# Patient Record
Sex: Female | Born: 2000 | Race: Black or African American | Hispanic: No | Marital: Single | Smoking: Current some day smoker
Health system: Southern US, Community
[De-identification: ages and names within clinical notes are randomized; demographics above are authoritative.]

## PROBLEM LIST (undated history)

## (undated) DIAGNOSIS — Z7289 Other problems related to lifestyle: Secondary | ICD-10-CM

## (undated) DIAGNOSIS — F332 Major depressive disorder, recurrent severe without psychotic features: Secondary | ICD-10-CM

---
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
## 2020-07-21 ENCOUNTER — Encounter (HOSPITAL_BASED_OUTPATIENT_CLINIC_OR_DEPARTMENT_OTHER): Payer: Self-pay

## 2020-07-21 ENCOUNTER — Other Ambulatory Visit: Payer: Self-pay

## 2020-07-21 ENCOUNTER — Emergency Department (HOSPITAL_BASED_OUTPATIENT_CLINIC_OR_DEPARTMENT_OTHER)
Admission: EM | Admit: 2020-07-21 | Discharge: 2020-07-21 | Disposition: A | Discharge status: Home / Self Care | Payer: Medicaid Other | Attending: Emergency Medicine | Admitting: Emergency Medicine

## 2020-07-21 DIAGNOSIS — Y9289 Other specified places as the place of occurrence of the external cause: Secondary | ICD-10-CM | POA: Diagnosis not present

## 2020-07-21 DIAGNOSIS — S0990XA Unspecified injury of head, initial encounter: Secondary | ICD-10-CM | POA: Insufficient documentation

## 2020-07-21 DIAGNOSIS — W208XXA Other cause of strike by thrown, projected or falling object, initial encounter: Secondary | ICD-10-CM | POA: Diagnosis not present

## 2020-07-21 DIAGNOSIS — H6122 Impacted cerumen, left ear: Secondary | ICD-10-CM | POA: Insufficient documentation

## 2020-07-21 DIAGNOSIS — F17290 Nicotine dependence, other tobacco product, uncomplicated: Secondary | ICD-10-CM | POA: Diagnosis not present

## 2020-07-21 DIAGNOSIS — Y990 Civilian activity done for income or pay: Secondary | ICD-10-CM | POA: Insufficient documentation

## 2020-07-21 DIAGNOSIS — Y9389 Activity, other specified: Secondary | ICD-10-CM | POA: Diagnosis not present

## 2020-07-21 NOTE — ED Triage Notes (Signed)
Pt arrives with c/o having a box fall on her head at work today, denies any neck or back pain, denies any LOC.

## 2020-07-21 NOTE — ED Provider Notes (Signed)
[facility] EMERGENCY DEPARTMENT Provider Note  CSN: [id number] Arrival date & time: 07/21/20 0756    History Chief Complaint  Patient presents with   Headache    Theresa Frye is a 20 y.o. female reports about 6am this morning while she was at work, moving some boxes, one of them fell off a shelf hitting her on the top of her head. She did not have LOC or pain initially. A few hours later while on her way home from work she noticed some soreness to her scalp and came to the ED for evaluation. She also reports some fullness in her left ear, not related to the head injury. She is not on a blood thinner.    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 No pertinent surgical history.  No family history on file.  Social History   Tobacco Use   Smoking status: Some Days    Pack years: 0.00    Types: Cigars   Smokeless tobacco: Never  Vaping Use   Vaping Use: Some days  Substance Use Topics   Alcohol use: Yes    Comment: social   Drug use: Never     Home Medications Prior to Admission medications   Medication Sig Start Date End Date Taking? Authorizing Provider  cetirizine (ZYRTEC) 10 MG tablet Take 10 mg by mouth daily. 10/08/16   [provider]  FLUoxetine (PROZAC) 20 MG capsule Take 1 capsule (20 mg total) by mouth daily. 10/19/16   Denzil Magnuson, NP  hydrocortisone cream 0.5 % Apply 1 application topically as needed for itching.    [provider]  hydrOXYzine (ATARAX/VISTARIL) 25 MG tablet Take 1 tablet (25 mg total) by mouth at bedtime. 10/18/16   Denzil Magnuson, NP     Allergies    Patient has no known allergies.   Review of Systems   Review of Systems A comprehensive review of systems was completed and negative except as noted in HPI.    Physical Exam BP 126/76   Pulse 86   Temp  98.2 F (36.8 C) (Oral)   Resp 18   Ht 5\' 6"  (1.676 m)   Wt 68 kg   LMP 06/26/2020   SpO2 100%   BMI 24.21 kg/m   Physical Exam Vitals and nursing note reviewed.  Constitutional:      Appearance: Normal appearance.  HENT:     Head: Normocephalic.     Comments: Mild soft tissue swelling to scalp, no hematoma or signs of depressed skull fracture    Right Ear: Tympanic membrane and ear canal normal.     Left Ear: Tympanic membrane normal. There is impacted cerumen.     Nose: Nose normal.     Mouth/Throat:     Mouth: Mucous membranes are moist.  Eyes:     Extraocular Movements: Extraocular movements intact.     Conjunctiva/sclera: Conjunctivae normal.  Cardiovascular:     Rate and Rhythm: Normal rate.  Pulmonary:     Effort: Pulmonary effort is normal.     Breath sounds: Normal breath sounds.  Abdominal:     General: Abdomen is flat.     Palpations: Abdomen is soft.     Tenderness: There is no abdominal tenderness.  Musculoskeletal:        General: No swelling. Normal range of motion.     Cervical back: Neck  supple.  Skin:    General: Skin is warm and dry.  Neurological:     General: No focal deficit present.     Mental Status: She is alert and oriented to person, place, and time.     Cranial Nerves: No cranial nerve deficit.     Sensory: No sensory deficit.     Motor: No weakness.     Gait: Gait normal.  Psychiatric:        Mood and Affect: Mood normal.     ED Results / Procedures / Treatments   Labs (all labs ordered are listed, but only abnormal results are displayed) Labs Reviewed - No data to display  EKG None   Radiology No results found.  Procedures .Ear Cerumen Removal  Date/Time: 07/21/2020 8:59 AM Performed by: Pollyann Savoy, MD Authorized by: Pollyann Savoy, MD   Consent:    Consent obtained:  Verbal   Consent given by:  Patient Procedure details:    Location:  L ear   Procedure type: curette     Procedure outcomes: cerumen  removed   Post-procedure details:    Inspection:  Ear canal clear and TM intact   Hearing quality:  Improved   Procedure completion:  Tolerated  Medications Ordered in the ED Medications - No data to display   MDM Rules/Calculators/A&P MDM Patient with minor head injury, no LOC, no pain initially with superficial scalp swelling. No concern for clinically significant intracranial injury. Patient and SO at bedside are requesting imaging but I explained that it was not indicated and potentially harmful. Risks outweigh benefits.   ED Course  I have reviewed the triage vital signs and the nursing notes.  Pertinent labs & imaging results that were available during my care of the patient were reviewed by me and considered in my medical decision making (see chart for details).     Final Clinical Impression(s) / ED Diagnoses Final diagnoses:  Minor head injury, initial encounter  Impacted cerumen of left ear    Rx / DC Orders ED Discharge Orders     None        Pollyann Savoy, MD 07/21/20 0900

## 2020-11-21 ENCOUNTER — Encounter (HOSPITAL_BASED_OUTPATIENT_CLINIC_OR_DEPARTMENT_OTHER): Payer: Self-pay

## 2020-11-21 ENCOUNTER — Emergency Department (HOSPITAL_BASED_OUTPATIENT_CLINIC_OR_DEPARTMENT_OTHER)
Admission: EM | Admit: 2020-11-21 | Discharge: 2020-11-21 | Disposition: A | Discharge status: Home / Self Care | Payer: Medicaid Other | Attending: Student | Admitting: Student

## 2020-11-21 ENCOUNTER — Other Ambulatory Visit: Payer: Self-pay

## 2020-11-21 ENCOUNTER — Emergency Department (HOSPITAL_BASED_OUTPATIENT_CLINIC_OR_DEPARTMENT_OTHER): Payer: Medicaid Other

## 2020-11-21 DIAGNOSIS — R059 Cough, unspecified: Secondary | ICD-10-CM | POA: Diagnosis present

## 2020-11-21 DIAGNOSIS — Z20822 Contact with and (suspected) exposure to covid-19: Secondary | ICD-10-CM | POA: Insufficient documentation

## 2020-11-21 DIAGNOSIS — J101 Influenza due to other identified influenza virus with other respiratory manifestations: Secondary | ICD-10-CM | POA: Insufficient documentation

## 2020-11-21 DIAGNOSIS — F17290 Nicotine dependence, other tobacco product, uncomplicated: Secondary | ICD-10-CM | POA: Diagnosis not present

## 2020-11-21 LAB — RESP PANEL BY RT-PCR (FLU A&B, COVID) ARPGX2
Influenza A by PCR: POSITIVE — AB
Influenza B by PCR: NEGATIVE
SARS Coronavirus 2 by RT PCR: NEGATIVE

## 2020-11-21 MED ORDER — BENZONATATE 100 MG PO CAPS: 100.0000 mg | ORAL_CAPSULE | Freq: Three times a day (TID) | ORAL | 0 refills | Status: DC

## 2020-11-21 MED ORDER — PREDNISONE 20 MG PO TABS: 40.0000 mg | ORAL_TABLET | Freq: Every day | ORAL | 0 refills | Status: AC

## 2020-11-21 NOTE — Discharge Instructions (Addendum)
Your chest x-ray was negative.  Respiratory panel was positive for flu.  Continue taking loratadine and start taking your Flonase consistently as well to help with sinus congestion.  I have sent in Tessalon Perles and prednisone for you.  Return to the emergency room if you have uncontrolled fever, or vomiting to the point you are unable to tolerate any food or drink.  Otherwise you can follow-up with your primary care provider.

## 2020-11-21 NOTE — ED Provider Notes (Signed)
[facility] EMERGENCY DEPARTMENT Provider Note   CSN: [id number] Arrival date & time: 11/21/20  1154     History Chief Complaint  Patient presents with   Cough    Chest pain    Theresa Frye is a 20 y.o. female.  19 year old female presents today for evaluation of sinus congestion, and productive cough of 2-week duration.  Patient reports she has tried over-the-counter medicine without any relief including loratadine.  She denies any fever, nausea, abdominal pain, or significant shortness of breath.  She does report some chest pain following coughing spells.  She also reports following coughing spells when she gets a little short of breath requiring her to use her albuterol inhaler.  She denies any lightheadedness, or palpitations.  The history is provided by the patient. No language interpreter was used.      Past Medical History:  Diagnosis Date   Deliberate self-cutting    MDD (major depressive disorder), recurrent severe, without psychosis ([place]) 10/14/2016    Patient Active Problem List   Diagnosis Date Noted   MDD (major depressive disorder), recurrent severe, without psychosis ([place]) 10/14/2016   Social anxiety disorder 10/14/2016   Insomnia 10/14/2016   MDD (major depressive disorder) 10/13/2016    History reviewed. No pertinent surgical history.   OB History   No obstetric history on file.     History reviewed. No pertinent family history.  Social History   Tobacco Use   Smoking status: Some Days    Types: Cigars   Smokeless tobacco: Never  Vaping Use   Vaping Use: Some days  Substance Use Topics   Alcohol use: Yes    Comment: social   Drug use: Never    Home Medications Prior to Admission medications   Medication Sig Start Date End Date Taking? Authorizing Provider  cetirizine (ZYRTEC) 10 MG tablet Take 10 mg by mouth daily. 10/08/16   [provider]  FLUoxetine (PROZAC) 20 MG capsule Take 1 capsule (20 mg total)  by mouth daily. 10/19/16   Mordecai Maes, NP  hydrocortisone cream 0.5 % Apply 1 application topically as needed for itching.    [provider]  hydrOXYzine (ATARAX/VISTARIL) 25 MG tablet Take 1 tablet (25 mg total) by mouth at bedtime. 10/18/16   Mordecai Maes, NP    Allergies    Patient has no known allergies.  Review of Systems   Review of Systems  Constitutional:  Negative for activity change, chills and fever.  HENT:  Positive for congestion, postnasal drip and sinus pressure.   Respiratory:  Positive for cough. Negative for shortness of breath.   Cardiovascular:  Positive for chest pain (only following coughing spells.).  Gastrointestinal:  Negative for abdominal pain, nausea and vomiting.  Genitourinary:  Negative for decreased urine volume.  Musculoskeletal:  Negative for myalgias.  Neurological:  Negative for weakness and headaches.  All other systems reviewed and are negative.  Physical Exam Updated Vital Signs BP (!) 141/92 (BP Location: Left Arm)   Pulse 93   Temp 98.7 F (37.1 C) (Oral)   Resp 16   Ht 5\' 6"  (1.676 m)   Wt 68 kg   SpO2 99%   BMI 24.21 kg/m   Physical Exam Vitals and nursing note reviewed.  Constitutional:      General: She is not in acute distress.    Appearance: Normal appearance. She is not ill-appearing.  HENT:     Head: Normocephalic and atraumatic.     Nose: Nose normal.  Eyes:     General: No scleral icterus.    Extraocular Movements: Extraocular movements intact.     Conjunctiva/sclera: Conjunctivae normal.  Cardiovascular:     Rate and Rhythm: Normal rate and regular rhythm.     Pulses: Normal pulses.     Heart sounds: Normal heart sounds.  Pulmonary:     Effort: Pulmonary effort is normal. No respiratory distress.     Breath sounds: Normal breath sounds. No wheezing or rales.  Abdominal:     General: There is no distension.     Tenderness: There is no abdominal tenderness.  Musculoskeletal:        General:  Normal range of motion.     Cervical back: Normal range of motion.  Skin:    General: Skin is warm and dry.  Neurological:     General: No focal deficit present.     Mental Status: She is alert. Mental status is at baseline.    ED Results / Procedures / Treatments   Labs (all labs ordered are listed, but only abnormal results are displayed) Labs Reviewed  RESP PANEL BY RT-PCR (FLU A&B, COVID) [id number]    EKG None  Radiology No results found.  Procedures Procedures   Medications Ordered in ED Medications - No data to display  ED Course  I have reviewed the triage vital signs and the nursing notes.  Pertinent labs & imaging results that were available during my care of the patient were reviewed by me and considered in my medical decision making (see chart for details).    MDM Rules/Calculators/A&P                           20 year old female presents today for evaluation of 2-week duration of productive cough and sinus congestion.  She denies fever, shortness of breath.  She does report chest pain following coughing spells.  Respiratory panel with COVID, flu, and RSV ordered.  Chest x-ray ordered.  Chest x-ray unremarkable.  Respiratory panel significant for flu.  Return precautions discussed.  Symptomatic treatment discussed.  Discussed importance of follow-up with PCP.  Final Clinical Impression(s) / ED Diagnoses Final diagnoses:  None    Rx / DC Orders ED Discharge Orders     None        Marita Kansas, PA-C 11/21/20 1459    Glendora Score, MD 11/21/20 1609

## 2020-11-21 NOTE — ED Triage Notes (Signed)
Pt reports cough, congestion for past week, pt reports chest pain beginning yesterday.  Denies fever, denies n/v/d, eating and drinking normally.  Taking allergy medication (Loratadine) without improvement

## 2021-10-11 ENCOUNTER — Encounter (HOSPITAL_COMMUNITY): Payer: Self-pay

## 2021-10-11 ENCOUNTER — Ambulatory Visit (HOSPITAL_COMMUNITY)
Admission: EM | Admit: 2021-10-11 | Discharge: 2021-10-11 | Disposition: A | Discharge status: Home / Self Care | Payer: Medicaid Other | Attending: Emergency Medicine | Admitting: Emergency Medicine

## 2021-10-11 DIAGNOSIS — K5900 Constipation, unspecified: Secondary | ICD-10-CM

## 2021-10-11 DIAGNOSIS — R140 Abdominal distension (gaseous): Secondary | ICD-10-CM

## 2021-10-11 MED ORDER — POLYETHYLENE GLYCOL 3350 17 GM/SCOOP PO POWD: 17.0000 g | Freq: Every day | ORAL | 0 refills | Status: DC

## 2021-10-11 NOTE — ED Provider Notes (Signed)
[facility]    CSN: [id number] Arrival date & time: 10/11/21  1000     History   Chief Complaint Chief Complaint  Patient presents with   Abdominal Pain    HPI Theresa Frye is a 21 y.o. female.  Presents with 1 week history abdominal cramping, bloating, constipation. Last BM 1 week ago, usually has them every 2-3 days. Has been eating out a lot lately Denies nausea, vomiting  Has not tried anything for her symptoms Drinks a lot of water but does not eat much fiber  LMP last week, feels her menstrual cycle is related to constipation in the past  Past Medical History:  Diagnosis Date   Deliberate self-cutting    MDD (major depressive disorder), recurrent severe, without psychosis ([place]) 10/14/2016    Patient Active Problem List   Diagnosis Date Noted   MDD (major depressive disorder), recurrent severe, without psychosis ([place]) 10/14/2016   Social anxiety disorder 10/14/2016   Insomnia 10/14/2016   MDD (major depressive disorder) 10/13/2016    History reviewed. No pertinent surgical history.  OB History   No obstetric history on file.      Home Medications    Prior to Admission medications   Medication Sig Start Date End Date Taking? Authorizing Provider  polyethylene glycol powder (GLYCOLAX) 17 GM/SCOOP powder Take 17 g by mouth daily. 10/11/21  Yes Reyne Falconi, Vernice Jefferson    Family History History reviewed. No pertinent family history.  Social History Social History   Tobacco Use   Smoking status: Some Days    Types: Cigars   Smokeless tobacco: Never  Vaping Use   Vaping Use: Some days  Substance Use Topics   Alcohol use: Yes    Comment: social   Drug use: Never     Allergies   Patient has no known allergies.   Review of Systems Review of Systems  Gastrointestinal:  Positive for abdominal pain.   Per HPI  Physical Exam Triage Vital Signs ED Triage Vitals  Enc Vitals Group     BP 10/11/21 1034 130/70     Pulse  Rate 10/11/21 1034 80     Resp 10/11/21 1034 16     Temp 10/11/21 1034 98.8 F (37.1 C)     Temp Source 10/11/21 1034 Oral     SpO2 10/11/21 1034 98 %     Weight --      Height --      Head Circumference --      Peak Flow --      Pain Score 10/11/21 1035 5     Pain Loc --      Pain Edu? --      Excl. in [place]? --    No data found.  Updated Vital Signs BP 130/70 (BP Location: Left Arm)   Pulse 80   Temp 98.8 F (37.1 C) (Oral)   Resp 16   LMP 09/27/2021   SpO2 98%      Physical Exam Vitals and nursing note reviewed.  Constitutional:      Appearance: Normal appearance.  HENT:     Mouth/Throat:     Mouth: Mucous membranes are moist.     Pharynx: Oropharynx is clear.  Eyes:     Conjunctiva/sclera: Conjunctivae normal.  Cardiovascular:     Rate and Rhythm: Normal rate and regular rhythm.     Heart sounds: Normal heart sounds.  Pulmonary:     Effort: Pulmonary effort is normal. No respiratory distress.  Breath sounds: Normal breath sounds.  Abdominal:     General: Bowel sounds are normal.     Palpations: Abdomen is soft.     Tenderness: There is no abdominal tenderness. There is no right CVA tenderness, left CVA tenderness, guarding or rebound.     Comments: Generalized discomfort  Musculoskeletal:        General: Normal range of motion.  Skin:    General: Skin is warm and dry.  Neurological:     Mental Status: She is alert and oriented to person, place, and time.      UC Treatments / Results  Labs (all labs ordered are listed, but only abnormal results are displayed) Labs Reviewed - No data to display  EKG  Radiology No results found.  Procedures Procedures (including critical care time)  Medications Ordered in UC Medications - No data to display  Initial Impression / Assessment and Plan / UC Course  I have reviewed the triage vital signs and the nursing notes.  Pertinent labs & imaging results that were available during my care of the patient  were reviewed by me and considered in my medical decision making (see chart for details).  Likely constipation, especially given history of same after menstrual cycles Discussed MiraLAX cleanse out Provided list of high-fiber foods, discussed diet changes in regards to less eating out and more fruits/veg Return precautions discussed. Patient agrees to plan  Final Clinical Impressions(s) / UC Diagnoses   Final diagnoses:  Constipation, unspecified constipation type  Abdominal bloating     Discharge Instructions      I recommend trying the polyethylene glycol cleanse out. (1 capful = 17 g, which is one dose).  Increase your water and fiber intake.  Please follow up if you do not have a bowel movement within 2-3 days.     ED Prescriptions     Medication Sig Dispense Auth. Provider   polyethylene glycol powder (GLYCOLAX) 17 GM/SCOOP powder Take 17 g by mouth daily. 255 g Joziyah Roblero, Wells Guiles, PA-C      PDMP not reviewed this encounter.   Jesse Nosbisch, Wells Guiles, PA-C 10/11/21 1139

## 2021-10-11 NOTE — ED Triage Notes (Signed)
Pt c/o constipation with abdominal pain and bloating x1wk. Denies taking anything for sx's. Hx of same. Last NBM a week ago.

## 2021-10-11 NOTE — Discharge Instructions (Addendum)
I recommend trying the polyethylene glycol cleanse out. (1 capful = 17 g, which is one dose).  Increase your water and fiber intake.  Please follow up if you do not have a bowel movement within 2-3 days.

## 2021-12-01 ENCOUNTER — Emergency Department (HOSPITAL_COMMUNITY)
Admission: EM | Admit: 2021-12-01 | Discharge: 2021-12-01 | Discharge status: Against Medical Advice | Payer: Medicaid Other | Attending: Emergency Medicine | Admitting: Emergency Medicine

## 2021-12-01 ENCOUNTER — Other Ambulatory Visit: Payer: Self-pay

## 2021-12-01 ENCOUNTER — Encounter (HOSPITAL_COMMUNITY): Payer: Self-pay

## 2021-12-01 DIAGNOSIS — Z5321 Procedure and treatment not carried out due to patient leaving prior to being seen by health care provider: Secondary | ICD-10-CM | POA: Insufficient documentation

## 2021-12-01 DIAGNOSIS — F410 Panic disorder [episodic paroxysmal anxiety] without agoraphobia: Secondary | ICD-10-CM | POA: Diagnosis present

## 2021-12-01 LAB — RAPID URINE DRUG SCREEN, HOSP PERFORMED
Amphetamines: NOT DETECTED
Barbiturates: NOT DETECTED
Benzodiazepines: NOT DETECTED
Cocaine: NOT DETECTED
Opiates: NOT DETECTED
Tetrahydrocannabinol: POSITIVE — AB

## 2021-12-01 LAB — CBC
HCT: 37.2 % (ref 36.0–46.0)
Hemoglobin: 12.6 g/dL (ref 12.0–15.0)
MCH: 31.2 pg (ref 26.0–34.0)
MCHC: 33.9 g/dL (ref 30.0–36.0)
MCV: 92.1 fL (ref 80.0–100.0)
Platelets: 423 10*3/uL — ABNORMAL HIGH (ref 150–400)
RBC: 4.04 MIL/uL (ref 3.87–5.11)
RDW: 12.7 % (ref 11.5–15.5)
WBC: 7.6 10*3/uL (ref 4.0–10.5)
nRBC: 0 % (ref 0.0–0.2)

## 2021-12-01 LAB — COMPREHENSIVE METABOLIC PANEL
ALT: 18 U/L (ref 0–44)
AST: 20 U/L (ref 15–41)
Albumin: 4.1 g/dL (ref 3.5–5.0)
Alkaline Phosphatase: 78 U/L (ref 38–126)
Anion gap: 14 (ref 5–15)
BUN: 11 mg/dL (ref 6–20)
CO2: 20 mmol/L — ABNORMAL LOW (ref 22–32)
Calcium: 9.7 mg/dL (ref 8.9–10.3)
Chloride: 104 mmol/L (ref 98–111)
Creatinine, Ser: 0.99 mg/dL (ref 0.44–1.00)
GFR, Estimated: >60 mL/min (ref >60)
Glucose, Bld: 115 mg/dL — ABNORMAL HIGH (ref 70–99)
Potassium: 3.1 mmol/L — ABNORMAL LOW (ref 3.5–5.1)
Sodium: 138 mmol/L (ref 135–145)
Total Bilirubin: 0.8 mg/dL (ref 0.3–1.2)
Total Protein: 7.4 g/dL (ref 6.5–8.1)

## 2021-12-01 LAB — SALICYLATE LEVEL: Salicylate Lvl: <7 mg/dL — ABNORMAL LOW (ref 7.0–30.0)

## 2021-12-01 LAB — I-STAT BETA HCG BLOOD, ED (MC, WL, AP ONLY): I-stat hCG, quantitative: <5 m[IU]/mL (ref <5)

## 2021-12-01 LAB — ETHANOL: Alcohol, Ethyl (B): <10 mg/dL (ref <10)

## 2021-12-01 LAB — ACETAMINOPHEN LEVEL: Acetaminophen (Tylenol), Serum: <10 ug/mL — ABNORMAL LOW (ref 10–30)

## 2021-12-01 NOTE — ED Notes (Signed)
Pt stated she feels ok now and is just going to leave.Informed pt she has a room and the doctor is going to see her. Pt stated its ok.

## 2021-12-01 NOTE — ED Triage Notes (Signed)
Pt states she had a panic attack yesterday that has continued into today. Pt reports feeling overwhelmed, unable to sleep and feeling like she is in a dream. Pt denies SI/HI, AVH.

## 2021-12-15 DIAGNOSIS — F410 Panic disorder [episodic paroxysmal anxiety] without agoraphobia: Secondary | ICD-10-CM | POA: Insufficient documentation

## 2021-12-15 DIAGNOSIS — F411 Generalized anxiety disorder: Secondary | ICD-10-CM | POA: Insufficient documentation

## 2022-03-29 ENCOUNTER — Ambulatory Visit (HOSPITAL_COMMUNITY)
Admission: EM | Admit: 2022-03-29 | Discharge: 2022-03-29 | Disposition: A | Discharge status: Home / Self Care | Payer: Medicaid Other | Attending: Family Medicine | Admitting: Family Medicine

## 2022-03-29 ENCOUNTER — Encounter (HOSPITAL_COMMUNITY): Payer: Self-pay | Admitting: *Deleted

## 2022-03-29 DIAGNOSIS — J0110 Acute frontal sinusitis, unspecified: Secondary | ICD-10-CM

## 2022-03-29 MED ORDER — AMOXICILLIN-POT CLAVULANATE 875-125 MG PO TABS
1.0000 | ORAL_TABLET | Freq: Two times a day (BID) | ORAL | 0 refills | Status: AC
Start: 2022-03-29 — End: 2022-04-08

## 2022-03-29 NOTE — Discharge Instructions (Signed)
You were diagnosed with a sinus infection today.  I have sent out augmentin twice/day x 10 days.  You may also use over the counter claritin/zyrtec and flonase for your symptoms.

## 2022-03-29 NOTE — ED Triage Notes (Signed)
Pt states she has had cough, congestion, sneezing, and headache x 3-4 days. She has been taking benadryl last dose was last night.

## 2022-03-29 NOTE — ED Provider Notes (Signed)
[place]    CSN: [id number] Arrival date & time: 03/29/22  1209      History   Chief Complaint Chief Complaint  Patient presents with   Cough   Nasal Congestion   Headache    HPI Theresa Frye is a 22 y.o. female.   Patient is here for sinus congestion, drainage, cough, headaches x 4 days. Headache is frontal.  No fevers/chills . No wheezing or sob.  She has been taking benadryl OTC.  Mom was sick, but feeling better.  She has a h/o sinus infections.        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 No pertinent surgical history.  OB History   No obstetric history on file.      Home Medications    Prior to Admission medications   Medication Sig Start Date End Date Taking? Authorizing Provider  escitalopram (LEXAPRO) 10 MG tablet Take by mouth. 02/02/22  Yes [provider]  FLUoxetine (PROZAC) 20 MG capsule Take by mouth. 10/13/16  Yes [provider]  polyethylene glycol powder (GLYCOLAX) 17 GM/SCOOP powder Take 17 g by mouth daily. 10/11/21   Rising, Wells Guiles PA-C    Family History Family History  Problem Relation Age of Onset   Diabetes Mother    Breast cancer Mother    Diabetes Father     Social History Social History   Tobacco Use   Smoking status: Some Days    Types: Cigars   Smokeless tobacco: Never  Vaping Use   Vaping Use: Former  Substance Use Topics   Alcohol use: Yes    Comment: occasionally   Drug use: Never      Allergies   Patient has no known allergies.   Review of Systems Review of Systems  Constitutional: Negative.   HENT:  Positive for congestion, rhinorrhea and sinus pressure. Negative for sore throat.   Respiratory:  Positive for cough.   Gastrointestinal: Negative.   Musculoskeletal: Negative.   Neurological:  Positive for headaches.  Psychiatric/Behavioral: Negative.       Physical Exam Triage Vital Signs ED Triage Vitals  Enc Vitals Group     BP 03/29/22 1242 119/83     Pulse Rate 03/29/22 1242 (!) 108     Resp 03/29/22 1242 18     Temp 03/29/22 1242 99.3 F (37.4 C)     Temp Source 03/29/22 1242 Oral     SpO2 03/29/22 1242 98 %     Weight --      Height --      Head Circumference --      Peak Flow --      Pain Score 03/29/22 1240 4     Pain Loc --      Pain Edu? --      Excl. in [place]? --    No data found.  Updated Vital Signs BP 119/83 (BP Location: Left Arm)   Pulse (!) 108   Temp 99.3 F (37.4 C) (Oral)   Resp 18   LMP 03/09/2022 (Exact Date)  SpO2 98%   Visual Acuity Right Eye Distance:   Left Eye Distance:   Bilateral Distance:    Right Eye Near:   Left Eye Near:    Bilateral Near:     Physical Exam Constitutional:      Appearance: She is well-developed.  HENT:     Head: Normocephalic and atraumatic.     Nose:     Right Sinus: Maxillary sinus tenderness present.     Left Sinus: Maxillary sinus tenderness present.     Mouth/Throat:     Mouth: Mucous membranes are moist.  Cardiovascular:     Rate and Rhythm: Normal rate and regular rhythm.  Pulmonary:     Effort: Pulmonary effort is normal.     Breath sounds: Normal breath sounds.  Musculoskeletal:     Cervical back: Normal range of motion and neck supple.  Lymphadenopathy:     Cervical: No cervical adenopathy.  Skin:    General: Skin is warm.  Neurological:     Mental Status: She is alert.  Psychiatric:        Mood and Affect: Mood normal.      UC Treatments / Results   Labs (all labs ordered are listed, but only abnormal results are displayed) Labs Reviewed - No data to display  EKG   Radiology No results found.  Procedures Procedures (including critical care time)  Medications Ordered in UC Medications - No data to display  Initial Impression / Assessment and Plan / UC Course  I have reviewed the triage vital signs and the nursing notes.  Pertinent labs & imaging results that were available during my care of the patient were reviewed by me and considered in my medical decision making (see chart for details).  Final Clinical Impressions(s) / UC Diagnoses   Final diagnoses:  Acute non-recurrent frontal sinusitis     Discharge Instructions      You were diagnosed with a sinus infection today.  I have sent out augmentin twice/day x 10 days.  You may also use over the counter claritin/zyrtec and flonase for your symptoms.     ED Prescriptions     Medication Sig Dispense Auth. Provider   amoxicillin-clavulanate (AUGMENTIN) 875-125 MG tablet Take 1 tablet by mouth every 12 (twelve) hours for 10 days. 20 tablet Rondel Oh, MD      PDMP not reviewed this encounter.   Rondel Oh, MD 03/29/22 1314

## 2023-10-10 IMAGING — DX DG CHEST 1V PORT
1 series · 1 of 1 positions shown · non-contrast
Comparison: None.

CLINICAL DATA: Cough for a week.

EXAM:
PORTABLE CHEST 1 VIEW

[chest ap]
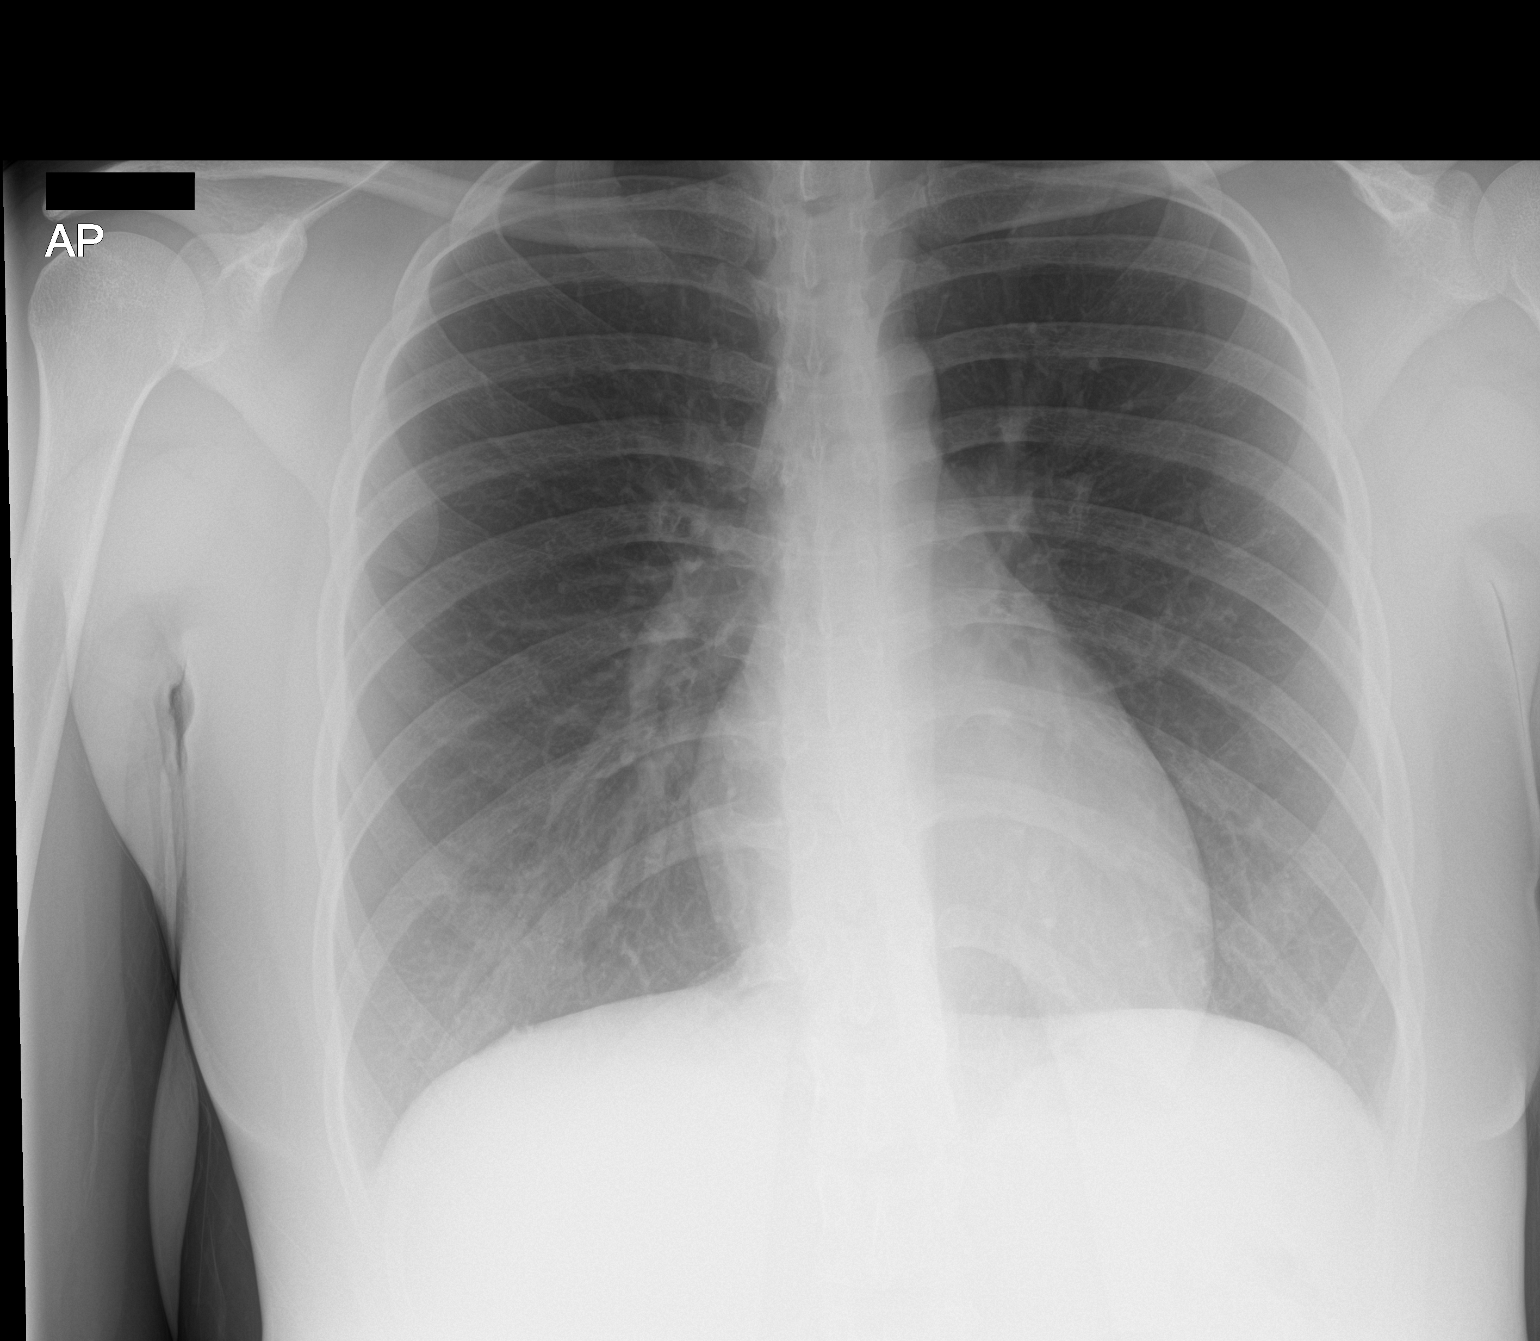

[1 of 1 positions shown; findings below may reference images not displayed]

FINDINGS: The heart size and mediastinal contours are within normal limits.
Both lungs are clear. The visualized skeletal structures are
unremarkable.
IMPRESSION: No active disease.
